# Patient Record
Sex: Female | Born: 1950
Health system: Southern US, Community
[De-identification: ages and names within clinical notes are randomized; demographics above are authoritative.]

## PROBLEM LIST (undated history)

## (undated) DIAGNOSIS — L409 Psoriasis, unspecified: Secondary | ICD-10-CM

## (undated) DIAGNOSIS — E039 Hypothyroidism, unspecified: Secondary | ICD-10-CM

## (undated) HISTORY — PX: EYE SURGERY: SHX253

---

## 1997-10-29 ENCOUNTER — Other Ambulatory Visit: Admission: RE | Admit: 1997-10-29 | Discharge: 1997-10-29 | Payer: Self-pay | Admitting: *Deleted

## 1997-11-21 ENCOUNTER — Other Ambulatory Visit: Admission: RE | Admit: 1997-11-21 | Discharge: 1997-11-21 | Payer: Self-pay | Admitting: Obstetrics and Gynecology

## 1998-11-29 ENCOUNTER — Other Ambulatory Visit: Admission: RE | Admit: 1998-11-29 | Discharge: 1998-11-29 | Payer: Self-pay | Admitting: Obstetrics and Gynecology

## 1998-12-13 ENCOUNTER — Encounter (HOSPITAL_BASED_OUTPATIENT_CLINIC_OR_DEPARTMENT_OTHER): Payer: Self-pay | Admitting: Internal Medicine

## 1998-12-13 ENCOUNTER — Ambulatory Visit (HOSPITAL_COMMUNITY): Admission: RE | Admit: 1998-12-13 | Discharge: 1998-12-13 | Payer: Self-pay | Admitting: Internal Medicine

## 2000-03-22 ENCOUNTER — Other Ambulatory Visit: Admission: RE | Admit: 2000-03-22 | Discharge: 2000-03-22 | Payer: Self-pay | Admitting: *Deleted

## 2001-02-27 ENCOUNTER — Emergency Department: Admission: EM | Admit: 2001-02-27 | Discharge: 2001-02-27 | Payer: Self-pay | Admitting: Emergency Medicine

## 2001-02-27 ENCOUNTER — Encounter: Payer: Self-pay | Admitting: Emergency Medicine

## 2002-03-08 ENCOUNTER — Other Ambulatory Visit: Admission: RE | Admit: 2002-03-08 | Discharge: 2002-03-08 | Payer: Self-pay | Admitting: Obstetrics & Gynecology

## 2003-10-18 ENCOUNTER — Other Ambulatory Visit: Admission: RE | Admit: 2003-10-18 | Discharge: 2003-10-18 | Payer: Self-pay | Admitting: Obstetrics and Gynecology

## 2005-06-19 ENCOUNTER — Encounter: Admission: RE | Admit: 2005-06-19 | Discharge: 2005-06-19 | Payer: Self-pay | Admitting: *Deleted

## 2008-11-14 ENCOUNTER — Encounter: Admission: RE | Admit: 2008-11-14 | Discharge: 2008-11-14 | Payer: Self-pay | Admitting: Family Medicine

## 2010-10-07 ENCOUNTER — Other Ambulatory Visit: Payer: Self-pay | Admitting: Obstetrics and Gynecology

## 2010-10-07 DIAGNOSIS — R1011 Right upper quadrant pain: Secondary | ICD-10-CM

## 2010-10-08 ENCOUNTER — Ambulatory Visit
Admission: RE | Admit: 2010-10-08 | Discharge: 2010-10-08 | Disposition: A | Discharge status: Home / Self Care | Payer: BC Managed Care – PPO | Source: Ambulatory Visit | Attending: Obstetrics and Gynecology | Admitting: Obstetrics and Gynecology

## 2010-10-08 DIAGNOSIS — R1011 Right upper quadrant pain: Secondary | ICD-10-CM

## 2010-10-13 ENCOUNTER — Other Ambulatory Visit (HOSPITAL_COMMUNITY): Payer: Self-pay | Admitting: Family Medicine

## 2010-10-13 DIAGNOSIS — R1011 Right upper quadrant pain: Secondary | ICD-10-CM

## 2010-10-23 ENCOUNTER — Encounter (HOSPITAL_COMMUNITY)
Admission: RE | Admit: 2010-10-23 | Discharge: 2010-10-23 | Disposition: A | Discharge status: Home / Self Care | Payer: BC Managed Care – PPO | Source: Ambulatory Visit | Attending: Family Medicine | Admitting: Family Medicine

## 2010-10-23 DIAGNOSIS — R1011 Right upper quadrant pain: Secondary | ICD-10-CM | POA: Insufficient documentation

## 2010-10-23 MED ORDER — TECHNETIUM TC 99M MEBROFENIN IV KIT
5.0000 | PACK | Freq: Once | INTRAVENOUS | Status: AC | PRN
  Administered 2010-10-23: 5 via INTRAVENOUS

## 2010-10-30 ENCOUNTER — Other Ambulatory Visit: Payer: Self-pay | Admitting: Gastroenterology

## 2010-11-03 ENCOUNTER — Other Ambulatory Visit: Payer: BC Managed Care – PPO

## 2011-12-04 IMAGING — NM NM HEPATO W/GB/PHARM/[PERSON_NAME]
2 series · 12 of 12 positions shown · non-contrast
Comparison: Ultrasound 10/08/2010

CLINICAL DATA: History of right upper quadrant pain.

NUCLEAR MEDICINE HEPATOBILIARY IMAGING WITH GALLBLADDER EF
TECHNIQUE: Sequential images of the abdomen were obtained [DATE]
minutes following intravenous administration of
radiopharmaceutical. After slow intravenous infusion of 1.68 uCg
Cholecystokinin, gallbladder ejection fraction was determined.
Radiopharmaceutical: 5.0 mCi Nc-BBm Choletec

[hepato · 3.20mm/px · 6 of 46 frames shown (1 of 2)]
[frame 4/46]
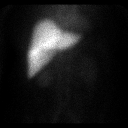
[frame 12/46]
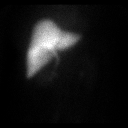
[frame 20/46]
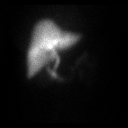
[frame 27/46]
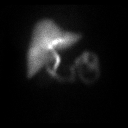
[frame 35/46]
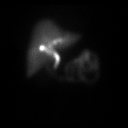
[frame 43/46]
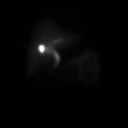

[hepato · 3.20mm/px · 6 of 31 frames shown (2 of 2)]
[frame 3/31]
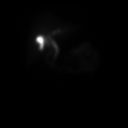
[frame 8/31]
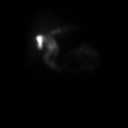
[frame 13/31]
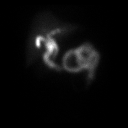
[frame 18/31]
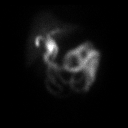
[frame 23/31]
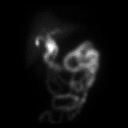
[frame 29/31]
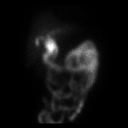

[12 of 12 positions shown; findings below may reference images not displayed]

FINDINGS: There is prompt visualization of hepatic activity. There
is prompt visualization of the common bile duct. Subsequently
intestinal activity was identified.

The gallbladder began to visualize at 31 minutes.

During CCK infusion the gallbladder contracted 93%. 30% or greater
is normal range.

During CCK infusion the patient reported  experiencing abdominal
discomfort during the last 10 minutes of CCK  infusion.
IMPRESSION: There is demonstration of patency of the common bile duct and the
cystic duct. There is no evidence of cholecystitis.

During CCK infusion the gallbladder contracted 93%. 30% or greater
is normal range.

During CCK infusion the patient reported  experiencing abdominal
discomfort during the last 10 minutes of CCK  infusion.

## 2019-12-27 ENCOUNTER — Other Ambulatory Visit: Payer: Self-pay

## 2019-12-27 ENCOUNTER — Encounter (HOSPITAL_BASED_OUTPATIENT_CLINIC_OR_DEPARTMENT_OTHER): Payer: Self-pay | Admitting: *Deleted

## 2019-12-27 ENCOUNTER — Emergency Department (HOSPITAL_BASED_OUTPATIENT_CLINIC_OR_DEPARTMENT_OTHER)
Admission: EM | Admit: 2019-12-27 | Discharge: 2019-12-27 | Disposition: A | Discharge status: Home / Self Care | Payer: Medicare Other | Attending: Emergency Medicine | Admitting: Emergency Medicine

## 2019-12-27 DIAGNOSIS — E039 Hypothyroidism, unspecified: Secondary | ICD-10-CM | POA: Insufficient documentation

## 2019-12-27 DIAGNOSIS — Y999 Unspecified external cause status: Secondary | ICD-10-CM | POA: Diagnosis not present

## 2019-12-27 DIAGNOSIS — L03115 Cellulitis of right lower limb: Secondary | ICD-10-CM | POA: Insufficient documentation

## 2019-12-27 DIAGNOSIS — Y929 Unspecified place or not applicable: Secondary | ICD-10-CM | POA: Diagnosis not present

## 2019-12-27 DIAGNOSIS — Z79899 Other long term (current) drug therapy: Secondary | ICD-10-CM | POA: Insufficient documentation

## 2019-12-27 DIAGNOSIS — S80861A Insect bite (nonvenomous), right lower leg, initial encounter: Secondary | ICD-10-CM | POA: Diagnosis present

## 2019-12-27 DIAGNOSIS — W57XXXA Bitten or stung by nonvenomous insect and other nonvenomous arthropods, initial encounter: Secondary | ICD-10-CM | POA: Insufficient documentation

## 2019-12-27 DIAGNOSIS — Y939 Activity, unspecified: Secondary | ICD-10-CM | POA: Diagnosis not present

## 2019-12-27 HISTORY — DX: Hypothyroidism, unspecified: E03.9

## 2019-12-27 MED ORDER — TRIAMCINOLONE ACETONIDE 0.1 % EX CREA: 1.0000 "application " | TOPICAL_CREAM | Freq: Two times a day (BID) | CUTANEOUS | 0 refills | Status: DC

## 2019-12-27 MED ORDER — DOXYCYCLINE HYCLATE 100 MG PO CAPS: 100.0000 mg | ORAL_CAPSULE | Freq: Two times a day (BID) | ORAL | 0 refills | Status: DC

## 2019-12-27 MED FILL — DOXYCYCLINE HYCLATE 100 MG: 100 | 7 days supply | Qty: 14 | Fill #0

## 2019-12-27 MED FILL — TRIAMCINOLONE 0.1% CREAM: 0.1 | 30 days supply | Qty: 30 | Fill #0

## 2019-12-27 NOTE — ED Notes (Signed)
AVS reviewed with client, area at rt leg unchanged since arrival, reviewed the two (2) RX written by the ED provider. Opportunity for questions provided.

## 2019-12-27 NOTE — Discharge Instructions (Signed)

## 2019-12-27 NOTE — ED Provider Notes (Signed)
[facility] EMERGENCY DEPARTMENT Provider Note   CSN: [id number] Arrival date & time: 12/27/19  1030     History Chief Complaint  Patient presents with  . Insect bite    Christina Yates is a 69 y.o. female.  Who presents emergency department chief complaint of insect bite and leg pain.  The patient states that she felt something sting or bite her on the right lower leg 2 days ago.  She applied ice to the affected area.  Over the past 48 hours the leg has become more and more painful, she is having pain with walking, palpation of the area.  It is better with ice on it.  The redness has spread around the front of her leg.  She denies fevers, chills, body aches.  HPI     Past Medical History:  Diagnosis Date  . Hypothyroid     There are no problems to display for this patient.   The histories are not reviewed yet. Please review them in the "History" navigator section and refresh this SmartLink.   OB History   No obstetric history on file.     History reviewed. No pertinent family history.  Social History   Tobacco Use  . Smoking status: Never Smoker  Substance Use Topics  . Alcohol use: Never  . Drug use: Never    Home Medications Prior to Admission medications   Medication Sig Start Date End Date Taking? Authorizing Provider  levothyroxine (SYNTHROID) 100 MCG tablet Take 100 mcg by mouth daily before breakfast.   Yes [provider]    Allergies    Zithromax [azithromycin]  Review of Systems   Review of Systems Ten systems reviewed and are negative for acute change, except as noted in the HPI.   Physical Exam Updated Vital Signs BP 127/70 (BP Location: Right Arm)   Pulse 80   Temp 98.1 F (36.7 C) (Oral)   Resp 18   Ht 5\' 8"  (1.727 m)   Wt 72.6 kg   SpO2 100%   BMI 24.33 kg/m   Physical Exam Vitals and nursing note reviewed.  Constitutional:      General: She is not in acute distress.    Appearance: She is well-developed. She  is not diaphoretic.  HENT:     Head: Normocephalic and atraumatic.  Eyes:     General: No scleral icterus.    Conjunctiva/sclera: Conjunctivae normal.  Cardiovascular:     Rate and Rhythm: Normal rate and regular rhythm.     Heart sounds: Normal heart sounds. No murmur heard.  No friction rub. No gallop.   Pulmonary:     Effort: Pulmonary effort is normal. No respiratory distress.     Breath sounds: Normal breath sounds.  Abdominal:     General: Bowel sounds are normal. There is no distension.     Palpations: Abdomen is soft. There is no mass.     Tenderness: There is no abdominal tenderness. There is no guarding.  Musculoskeletal:     Cervical back: Normal range of motion.     Comments: Erythema and induration of about 6 x 6 cm in the posterior right lower extremity.  It is tender to the touch without fluctuance.  There is some extending erythema without lymphangitis primarily around the area of induration.  There is some mild erythema spreading around the front of the leg.  DP and PT pulse 2+ bilaterally.  Normal sensation.  Skin:    General: Skin is warm and  dry.  Neurological:     Mental Status: She is alert and oriented to person, place, and time.  Psychiatric:        Behavior: Behavior normal.     ED Results / Procedures / Treatments   Labs (all labs ordered are listed, but only abnormal results are displayed) Labs Reviewed - No data to display  EKG None  Radiology No results found.  Procedures Procedures (including critical care time)  Medications Ordered in ED Medications - No data to display  ED Course  I have reviewed the triage vital signs and the nursing notes.  Pertinent labs & imaging results that were available during my care of the patient were reviewed by me and considered in my medical decision making (see chart for details).    MDM Rules/Calculators/A&P                          Patient with recent insect bite to the right lower extremity.   This is likely complicated, hyper reactivity to the sting versus cellulitis.  Patient will be covered with doxycycline.  Topical triamcinolone cream for decreased inflammatory response locally.  Discussed return precautions and outpatient follow-up.  Patient understands these return precautions clearly.  She appears appropriate for discharge at this time Final Clinical Impression(s) / ED Diagnoses Final diagnoses:  None    Rx / DC Orders ED Discharge Orders    None       Arthor Captain, PA-C 12/27/19 1205    Pollyann Savoy, MD 12/27/19 1455

## 2019-12-27 NOTE — ED Triage Notes (Signed)
States she was stung by an insect, believes it was a wasp, occurred this past Monday. Has a large red area at rt calf area, itching and painful, walking makes pain worse

## 2020-02-08 ENCOUNTER — Telehealth: Payer: Self-pay | Admitting: Hematology and Oncology

## 2020-02-08 NOTE — Telephone Encounter (Signed)
Received a new hem referral from [facility] for low platelets. Ms. Bornhorst returned my call to schedule a new hem appt w/Dr. Pamelia Hoit on 8/26 at 345pm. Pt aware to arrive 15 minutes early.

## 2020-02-14 NOTE — Progress Notes (Signed)
[facility] CONSULT NOTE  Patient Care Team: Jonnie Kind, MD as PCP - General (Family Medicine)  CHIEF COMPLAINTS/PURPOSE OF CONSULTATION:  Newly diagnosed thrombocytopenia  HISTORY OF PRESENTING ILLNESS:  Christina Yates 69 y.o. female is here because of recent diagnosis of persistent thrombocytopenia. She is referred from [facility]. Labs on 12/19/19 showed Hg 11.7, HCT 35.8, platelets 70, iron saturation 31%, ferritin 60, B-12 340, folate >20.0. Platelets were 137 on 11/06/18, 91 on 11/07/19, and 70 on 12/19/19. She presents to the clinic today for initial evaluation.  She has not noticed any bruising or bleeding symptoms.  This is never happened before.  I reviewed her records extensively and collaborated the history with the patient.  MEDICAL HISTORY:  Past Medical History:  Diagnosis Date  . Hypothyroid     SURGICAL HISTORY:  The histories are not reviewed yet. Please review them in the "History" navigator section and refresh this SmartLink.  SOCIAL HISTORY: Social History   Socioeconomic History  . Marital status: Married    Spouse name: Not on file  . Number of children: Not on file  . Years of education: Not on file  . Highest education level: Not on file  Occupational History  . Not on file  Tobacco Use  . Smoking status: Never Smoker  Substance and Sexual Activity  . Alcohol use: Never  . Drug use: Never  . Sexual activity: Not Currently  Other Topics Concern  . Not on file  Social History Narrative  . Not on file   Social Determinants of Health   Financial Resource Strain:   . Difficulty of Paying Living Expenses: Not on file  Food Insecurity:   . Worried About Programme researcher, broadcasting/film/video in the Last Year: Not on file  . Ran Out of Food in the Last Year: Not on file  Transportation Needs:   . Lack of Transportation (Medical): Not on file  . Lack of Transportation (Non-Medical): Not on file  Physical Activity:   . Days  of Exercise per Week: Not on file  . Minutes of Exercise per Session: Not on file  Stress:   . Feeling of Stress : Not on file  Social Connections:   . Frequency of Communication with Friends and Family: Not on file  . Frequency of Social Gatherings with Friends and Family: Not on file  . Attends Religious Services: Not on file  . Active Member of Clubs or Organizations: Not on file  . Attends Banker Meetings: Not on file  . Marital Status: Not on file  Intimate Partner Violence:   . Fear of Current or Ex-Partner: Not on file  . Emotionally Abused: Not on file  . Physically Abused: Not on file  . Sexually Abused: Not on file    FAMILY HISTORY: No family history on file.  ALLERGIES:  is allergic to zithromax [azithromycin].  MEDICATIONS:  Current Outpatient Medications  Medication Sig Dispense Refill  . levothyroxine (SYNTHROID) 100 MCG tablet Take 100 mcg by mouth daily before breakfast.     No current facility-administered medications for this visit.    REVIEW OF SYSTEMS:   Constitutional: Denies fevers, chills or abnormal night sweats Eyes: Denies blurriness of vision, double vision or watery eyes Ears, nose, mouth, throat, and face: Denies mucositis or sore throat Respiratory: Denies cough, dyspnea or wheezes Cardiovascular: Denies palpitation, chest discomfort or lower extremity swelling Gastrointestinal:  Denies nausea, heartburn or change in bowel habits Skin: Denies abnormal  skin rashes Lymphatics: Denies new lymphadenopathy or easy bruising Neurological:Denies numbness, tingling or new weaknesses Behavioral/Psych: Mood is stable, no new changes  Breast: Denies any palpable lumps or discharge All other systems were reviewed with the patient and are negative.  PHYSICAL EXAMINATION: ECOG PERFORMANCE STATUS: 1 - Symptomatic but completely ambulatory  Vitals:   02/15/20 1541  BP: 132/60  Pulse: 73  Resp: 17  Temp: 98.3 F (36.8 C)  SpO2: 100%    Filed Weights   02/15/20 1541  Weight: 160 lb 14.4 oz (73 kg)    GENERAL:alert, no distress and comfortable SKIN: skin color, texture, turgor are normal, no rashes or significant lesions EYES: normal, conjunctiva are pink and non-injected, sclera clear OROPHARYNX:no exudate, no erythema and lips, buccal mucosa, and tongue normal  NECK: supple, thyroid normal size, non-tender, without nodularity LYMPH:  no palpable lymphadenopathy in the cervical, axillary or inguinal LUNGS: clear to auscultation and percussion with normal breathing effort HEART: regular rate & rhythm and no murmurs and no lower extremity edema ABDOMEN:abdomen soft, non-tender and normal bowel sounds Musculoskeletal:no cyanosis of digits and no clubbing  PSYCH: alert & oriented x 3 with fluent speech NEURO: no focal motor/sensory deficits  LABORATORY DATA:  I have reviewed the data as listed Lab Results  Component Value Date   WBC 5.7 02/15/2020   HGB 11.9 (L) 02/15/2020   HCT 36.5 02/15/2020   MCV 87.5 02/15/2020   PLT 150 02/15/2020   No results found for: NA, K, CL, CO2  RADIOGRAPHIC STUDIES: I have personally reviewed the radiological reports and agreed with the findings in the report.  ASSESSMENT AND PLAN:  Thrombocytopenia (HCC)  persistent thrombocytopenia. She is referred from [facility].  Lab review: 12/19/19 showed Hg 11.7, HCT 35.8, platelets 70, iron saturation 31%, ferritin 60, B-12 340, folate >20.0.  Platelets were 137 on 11/06/19,  11/07/19: 91 12/19/19: 70   Differential diagnosis: 1. Low-grade ITP 2. medication induced: On further review patient is not taking any medications that are associated with thrombocytopenia 3. Bone marrow factors 4. Hepatitis B/C 5. Splenomegaly: No enlarged spleen was palpable to physical exam. 6.  Spurious thrombocytopenia due to clumping: We did not notice any clumping on EDTA.  Repeat labs 02/15/2020: Platelets 150 Given her history  of Hashimoto's thyroiditis and psoriatic arthritis suspect suspect that she may have an autoimmune thrombocytopenia.  However today her platelet counts have improved to 150. At this point there is no further indication for treatment or further evaluation. If her platelet counts drop below 50, then she will need to be referred back to [place]. Patient will be seen on an as-needed basis.  All questions were answered. The patient knows to call the clinic with any problems, questions or concerns.   Sabas Sous, MD, MPH 02/15/2020    I, Molly Dorshimer, am acting as scribe for Serena Croissant, MD.  I have reviewed the above documentation for accuracy and completeness, and I agree with the above.

## 2020-02-15 ENCOUNTER — Other Ambulatory Visit: Payer: Self-pay

## 2020-02-15 ENCOUNTER — Inpatient Hospital Stay (HOSPITAL_BASED_OUTPATIENT_CLINIC_OR_DEPARTMENT_OTHER): Payer: Medicare Other | Admitting: Hematology and Oncology

## 2020-02-15 ENCOUNTER — Inpatient Hospital Stay: Payer: Medicare Other | Attending: Hematology and Oncology

## 2020-02-15 DIAGNOSIS — E039 Hypothyroidism, unspecified: Secondary | ICD-10-CM

## 2020-02-15 DIAGNOSIS — D696 Thrombocytopenia, unspecified: Secondary | ICD-10-CM

## 2020-02-15 LAB — CMP (CANCER CENTER ONLY)
ALT: 13 U/L (ref 0–44)
AST: 21 U/L (ref 15–41)
Albumin: 3.7 g/dL (ref 3.5–5.0)
Alkaline Phosphatase: 74 U/L (ref 38–126)
Anion gap: 8 (ref 5–15)
BUN: 15 mg/dL (ref 8–23)
CO2: 30 mmol/L (ref 22–32)
Calcium: 9.9 mg/dL (ref 8.9–10.3)
Chloride: 103 mmol/L (ref 98–111)
Creatinine: 0.97 mg/dL (ref 0.44–1.00)
GFR, Est AFR Am: >60 mL/min (ref >60)
GFR, Estimated: 59 mL/min — ABNORMAL LOW (ref >60)
Glucose, Bld: 89 mg/dL (ref 70–99)
Potassium: 4.5 mmol/L (ref 3.5–5.1)
Sodium: 141 mmol/L (ref 135–145)
Total Bilirubin: 0.6 mg/dL (ref 0.3–1.2)
Total Protein: 7.4 g/dL (ref 6.5–8.1)

## 2020-02-15 LAB — CBC WITH DIFFERENTIAL (CANCER CENTER ONLY)
Abs Immature Granulocytes: 0.02 10*3/uL (ref 0.00–0.07)
Basophils Absolute: 0 10*3/uL (ref 0.0–0.1)
Basophils Relative: 1 %
Eosinophils Absolute: 0.1 10*3/uL (ref 0.0–0.5)
Eosinophils Relative: 2 %
HCT: 36.5 % (ref 36.0–46.0)
Hemoglobin: 11.9 g/dL — ABNORMAL LOW (ref 12.0–15.0)
Immature Granulocytes: 0 %
Lymphocytes Relative: 25 %
Lymphs Abs: 1.4 10*3/uL (ref 0.7–4.0)
MCH: 28.5 pg (ref 26.0–34.0)
MCHC: 32.6 g/dL (ref 30.0–36.0)
MCV: 87.5 fL (ref 80.0–100.0)
Monocytes Absolute: 0.6 10*3/uL (ref 0.1–1.0)
Monocytes Relative: 10 %
Neutro Abs: 3.5 10*3/uL (ref 1.7–7.7)
Neutrophils Relative %: 62 %
Platelet Count: 150 10*3/uL (ref 150–400)
RBC: 4.17 MIL/uL (ref 3.87–5.11)
RDW: 12.7 % (ref 11.5–15.5)
WBC Count: 5.7 10*3/uL (ref 4.0–10.5)
nRBC: 0 % (ref 0.0–0.2)

## 2020-02-15 LAB — HEPATITIS B SURFACE ANTIGEN: Hepatitis B Surface Ag: NONREACTIVE

## 2020-02-15 LAB — HEPATITIS C ANTIBODY: HCV Ab: NONREACTIVE

## 2020-02-15 LAB — IMMATURE PLATELET FRACTION: Immature Platelet Fraction: 5.4 % (ref 1.2–8.6)

## 2020-02-15 LAB — PLATELET BY CITRATE

## 2020-02-15 LAB — VITAMIN B12: Vitamin B-12: 177 pg/mL — ABNORMAL LOW (ref 180–914)

## 2020-02-15 NOTE — Assessment & Plan Note (Signed)
persistent thrombocytopenia. She is referred from [facility].  Lab review: 12/19/19 showed Hg 11.7, HCT 35.8, platelets 70, iron saturation 31%, ferritin 60, B-12 340, folate >20.0.  Platelets were 137 on 11/06/19,  11/07/19: 91 12/19/19: 70  Differential diagnosis: 1. Low-grade ITP 2. medication induced: On further review patient is not taking any medications that are associated with thrombocytopenia 3. Bone marrow factors 4. Hepatitis B/C 5. Splenomegaly: No enlarged spleen was palpable to physical exam. 6.  Spurious thrombocytopenia due to clumping

## 2020-02-16 ENCOUNTER — Telehealth: Payer: Self-pay

## 2020-02-16 ENCOUNTER — Telehealth: Payer: Self-pay | Admitting: Hematology and Oncology

## 2020-02-16 NOTE — Telephone Encounter (Signed)
No 8/26 los, no changes made to pt schedule  

## 2020-02-16 NOTE — Telephone Encounter (Signed)
Pt called to inquire about lab results from 8/26. Noticed Vit B12 is low. Please advise.

## 2020-02-19 ENCOUNTER — Telehealth: Payer: Self-pay | Admitting: Hematology and Oncology

## 2020-02-19 NOTE — Telephone Encounter (Signed)
I left a voicemail for the patient that the B12 level is extremely low and that she needs to take 5000 mcg of sublingual B12 daily or discussed with her primary care physician or [facility] for further directions regarding this.

## 2020-07-28 ENCOUNTER — Encounter (HOSPITAL_BASED_OUTPATIENT_CLINIC_OR_DEPARTMENT_OTHER): Payer: Self-pay | Admitting: Emergency Medicine

## 2020-07-28 ENCOUNTER — Other Ambulatory Visit: Payer: Self-pay

## 2020-07-28 ENCOUNTER — Emergency Department (HOSPITAL_BASED_OUTPATIENT_CLINIC_OR_DEPARTMENT_OTHER)
Admission: EM | Admit: 2020-07-28 | Discharge: 2020-07-28 | Disposition: A | Discharge status: Home / Self Care | Payer: Medicare Other | Attending: Emergency Medicine | Admitting: Emergency Medicine

## 2020-07-28 DIAGNOSIS — R21 Rash and other nonspecific skin eruption: Secondary | ICD-10-CM | POA: Diagnosis present

## 2020-07-28 DIAGNOSIS — Z79899 Other long term (current) drug therapy: Secondary | ICD-10-CM | POA: Insufficient documentation

## 2020-07-28 DIAGNOSIS — B029 Zoster without complications: Secondary | ICD-10-CM | POA: Diagnosis not present

## 2020-07-28 DIAGNOSIS — E039 Hypothyroidism, unspecified: Secondary | ICD-10-CM | POA: Diagnosis not present

## 2020-07-28 HISTORY — DX: Psoriasis, unspecified: L40.9

## 2020-07-28 MED ORDER — VALACYCLOVIR HCL 1 G PO TABS: 1000.0000 mg | ORAL_TABLET | Freq: Three times a day (TID) | ORAL | 0 refills | Status: AC

## 2020-07-28 NOTE — ED Triage Notes (Signed)
Blistery rash to L abd with pain to L arm. Concerned for shingles.

## 2020-07-28 NOTE — Discharge Instructions (Signed)
At this time there does not appear to be the presence of an emergent medical condition, however there is always the potential for conditions to change. Please read and follow the below instructions.  Please return to the Emergency Department immediately for any new or worsening symptoms. Please be sure to follow up with your Primary Care Provider within one week regarding your visit today; please call their office to schedule an appointment even if you are feeling better for a follow-up visit. Please take the medication Valtrex as prescribed to help with your shingles rash.  Please see your primary care doctor this week for recheck.  Go to the nearest Emergency Department immediately if: You have fever or chills The rash is on your face or nose. You have pain in your face or pain by your eye. You lose feeling on one side of your face. You have trouble seeing. You have ear pain, or you have ringing in your ear. You have a loss of taste. You have any new/concerning or worsening of symtpoms   Please read the additional information packets attached to your discharge summary.  Do not take your medicine if  develop an itchy rash, swelling in your mouth or lips, or difficulty breathing; call 911 and seek immediate emergency medical attention if this occurs.  You may review your lab tests and imaging results in their entirety on your MyChart account.  Please discuss all results of fully with your primary care provider and other specialist at your follow-up visit.  Note: Portions of this text may have been transcribed using voice recognition software. Every effort was made to ensure accuracy; however, inadvertent computerized transcription errors may still be present.

## 2020-07-28 NOTE — ED Provider Notes (Signed)
[facility] EMERGENCY DEPARTMENT Provider Note   CSN: [id number] Arrival date & time: 07/28/20  1121     History Chief Complaint  Patient presents with  . Rash    Christina Yates is a 70 y.o. female history of hypothyroidism and psoriasis.  Patient arrives today with concern of rash to her left side onset 3 days ago.  Patient reports initially she had a tingling sensation to her left side as well as her left arm and a mild itching sensation.  The left arm itching and tingling subsided, the next day she noticed a burning tingling a rash to her left side.  She reports small blisters to the area which have begun to rupture and spread in a linear fashion across her left side.  She reports the burning tingling pain is moderate intensity constant worse with palpation no alleviating factors.  She denies similar in the past.  Reports that she did have chickenpox when she was younger.  Denies fever/chills, headache, vision changes, hearing loss, tinnitus, neck stiffness, chest pain, shortness of breath, nausea/vomiting or any additional concerns   HPI     Past Medical History:  Diagnosis Date  . Hypothyroid   . Psoriasis     Patient Active Problem List   Diagnosis Date Noted  . Thrombocytopenia (HCC) 02/15/2020    Past Surgical History:  Procedure Laterality Date  . EYE SURGERY       OB History   No obstetric history on file.     No family history on file.  Social History   Tobacco Use  . Smoking status: Never Smoker  . Smokeless tobacco: Never Used  Substance Use Topics  . Alcohol use: Never  . Drug use: Never    Home Medications Prior to Admission medications   Medication Sig Start Date End Date Taking? Authorizing Provider  valACYclovir (VALTREX) 1000 MG tablet Take 1 tablet (1,000 mg total) by mouth 3 (three) times daily for 7 days. 07/28/20 08/04/20 Yes Harlene Salts A, PA-C  levothyroxine (SYNTHROID) 100 MCG tablet Take 100 mcg by mouth daily before  breakfast.    [provider]    Allergies    Prednisone and Zithromax [azithromycin]  Review of Systems   Review of Systems  Constitutional: Negative.  Negative for chills and fever.  Eyes: Negative.  Negative for pain and visual disturbance.  Cardiovascular: Negative.  Negative for chest pain.  Gastrointestinal: Negative.  Negative for abdominal pain, nausea and vomiting.  Skin: Positive for rash.  Neurological: Negative.  Negative for headaches.     Physical Exam Updated Vital Signs BP (!) 147/71 (BP Location: Right Arm)   Pulse 77   Temp 98.2 F (36.8 C) (Oral)   Resp 16   Ht 5\' 8"  (1.727 m)   Wt 72.6 kg   SpO2 96%   BMI 24.33 kg/m   Physical Exam Constitutional:      General: She is not in acute distress.    Appearance: Normal appearance. She is well-developed. She is not ill-appearing or diaphoretic.  HENT:     Head: Normocephalic and atraumatic.  Eyes:     General: Vision grossly intact. Gaze aligned appropriately.     Pupils: Pupils are equal, round, and reactive to light.  Neck:     Trachea: Trachea and phonation normal.  Pulmonary:     Effort: Pulmonary effort is normal. No respiratory distress.  Abdominal:     General: There is no distension.     Palpations: Abdomen  is soft.     Tenderness: There is no abdominal tenderness. There is no guarding or rebound.  Musculoskeletal:        General: Normal range of motion.     Cervical back: Normal range of motion.  Skin:    General: Skin is warm and dry.     Findings: Rash present. Rash is vesicular.          Comments: Erythematous grouped vesicular rash of the left side paraspinally T10 distribution. Some ruptured vesicles present  Neurological:     Mental Status: She is alert.     GCS: GCS eye subscore is 4. GCS verbal subscore is 5. GCS motor subscore is 6.     Comments: Speech is clear and goal oriented, follows commands Major Cranial nerves without deficit, no facial droop Moves extremities  without ataxia, coordination intact  Psychiatric:        Behavior: Behavior normal.     ED Results / Procedures / Treatments   Labs (all labs ordered are listed, but only abnormal results are displayed) Labs Reviewed - No data to display  EKG None  Radiology No results found.  Procedures Procedures   Medications Ordered in ED Medications - No data to display  ED Course  I have reviewed the triage vital signs and the nursing notes.  Pertinent labs & imaging results that were available during my care of the patient were reviewed by me and considered in my medical decision making (see chart for details).    MDM Rules/Calculators/A&P                         Additional history obtained from: 1. Nursing notes from this visit. 2. EMR review. ------------------  70 year old female presents today with rash consistent with shingles present in the left T10 distribution onset 3 days ago.  Patient denies any difficulty breathing or swallowing.  Pt has a patent airway without stridor and is handling secretions without difficulty; no angioedema. No pustules, no warmth, no draining sinus tracts, no superficial abscesses, no bullous impetigo, no desquamation, no target lesions with dusky purpura or a central bulla. Not tender to touch. No concern for superimposed infection. No concern for SJS, TEN, TSS, tick borne illness, syphilis or other life-threatening condition.  Will start patient on Valtrex 1 g 3 times daily x7 days and encourage close PCP follow-up.  There is no evidence for zoster ophthalmicus, CNS involvement or other emergent pathologies.  At this time there does not appear to be any evidence of an acute emergency medical condition and the patient appears stable for discharge with appropriate outpatient follow up. Diagnosis was discussed with patient who verbalizes understanding of care plan and is agreeable to discharge. I have discussed return precautions with patient who verbalizes  understanding. Patient encouraged to follow-up with their PCP. All questions answered.  Patient's case discussed with Dr. Renaye Rakers who agrees with plan to discharge with follow-up.   Note: Portions of this report may have been transcribed using voice recognition software. Every effort was made to ensure accuracy; however, inadvertent computerized transcription errors may still be present. Final Clinical Impression(s) / ED Diagnoses Final diagnoses:  Herpes zoster without complication    Rx / DC Orders ED Discharge Orders         Ordered    valACYclovir (VALTREX) 1000 MG tablet  3 times daily        07/28/20 1226  Elizabeth Palau 07/28/20 1229    Terald Sleeper, MD 07/28/20 [phone]

## 2023-11-10 ENCOUNTER — Emergency Department (HOSPITAL_BASED_OUTPATIENT_CLINIC_OR_DEPARTMENT_OTHER): Admission: EM | Admit: 2023-11-10 | Discharge: 2023-11-10 | Disposition: A | Discharge status: Home / Self Care

## 2023-11-10 ENCOUNTER — Encounter (HOSPITAL_BASED_OUTPATIENT_CLINIC_OR_DEPARTMENT_OTHER): Payer: Self-pay | Admitting: Emergency Medicine

## 2023-11-10 ENCOUNTER — Other Ambulatory Visit: Payer: Self-pay

## 2023-11-10 DIAGNOSIS — N390 Urinary tract infection, site not specified: Secondary | ICD-10-CM | POA: Insufficient documentation

## 2023-11-10 DIAGNOSIS — E039 Hypothyroidism, unspecified: Secondary | ICD-10-CM | POA: Insufficient documentation

## 2023-11-10 DIAGNOSIS — R300 Dysuria: Secondary | ICD-10-CM | POA: Diagnosis present

## 2023-11-10 LAB — URINALYSIS, ROUTINE W REFLEX MICROSCOPIC
Bilirubin Urine: NEGATIVE
Glucose, UA: NEGATIVE mg/dL
Ketones, ur: NEGATIVE mg/dL
Nitrite: NEGATIVE
Protein, ur: NEGATIVE mg/dL
Specific Gravity, Urine: <=1.005 (ref 1.005–1.030)
pH: 5.5 (ref 5.0–8.0)

## 2023-11-10 LAB — URINALYSIS, MICROSCOPIC (REFLEX)

## 2023-11-10 MED ORDER — CEPHALEXIN 250 MG PO CAPS
500.0000 mg | ORAL_CAPSULE | Freq: Once | ORAL | Status: AC
  Administered 2023-11-10: 500 mg via ORAL
  Filled 2023-11-10: qty 2

## 2023-11-10 MED ORDER — PHENAZOPYRIDINE HCL 200 MG PO TABS: 200.0000 mg | ORAL_TABLET | Freq: Three times a day (TID) | ORAL | 0 refills | Status: AC

## 2023-11-10 MED ORDER — CEPHALEXIN 500 MG PO CAPS: 500.0000 mg | ORAL_CAPSULE | Freq: Two times a day (BID) | ORAL | 0 refills | Status: AC

## 2023-11-10 NOTE — ED Provider Notes (Signed)
 [facility] Provider Note   CSN: [id number] Arrival date & time: 11/10/23  1303     History  Chief Complaint  Patient presents with   Dysuria    Christina Yates is a 73 y.o. female.  73 year old female with past medical history of hypothyroidism and psoriasis presenting to the emergency department today with increased urinary frequency and urgency over the past 3 to 4 days.  The patient states that she is having some dysuria as well.  States this is similar to previous episodes of urinary tract infections in the past.  She denies any flank pain.  Denies any fevers.  Has not had any nausea or vomiting.  She came to the ER today for further evaluation regarding this.  She states that she has been taking some over-the-counter medications for UTI which have not been helping.   Dysuria      Home Medications Prior to Admission medications   Medication Sig Start Date End Date Taking? Authorizing Provider  cephALEXin (KEFLEX) 500 MG capsule Take 1 capsule (500 mg total) by mouth 2 (two) times daily. 11/10/23  Yes Carin Charleston, MD  phenazopyridine (PYRIDIUM) 200 MG tablet Take 1 tablet (200 mg total) by mouth 3 (three) times daily. 11/10/23  Yes Carin Charleston, MD  levothyroxine (SYNTHROID) 100 MCG tablet Take 100 mcg by mouth daily before breakfast.    [provider]      Allergies    Prednisone and Zithromax [azithromycin]    Review of Systems   Review of Systems  Genitourinary:  Positive for dysuria.  All other systems reviewed and are negative.   Physical Exam Updated Vital Signs BP (!) 148/69 (BP Location: Right Arm)   Pulse 77   Temp 98 F (36.7 C) (Oral)   Resp 20   Ht 5' 8.5" (1.74 m)   Wt 72.6 kg   SpO2 100%   BMI 23.97 kg/m  Physical Exam Vitals and nursing note reviewed.   Gen: NAD Eyes: PERRL, EOMI HEENT: no oropharyngeal swelling Neck: trachea midline Resp: clear to auscultation bilaterally Card:  RRR, no murmurs, rubs, or gallops Abd: nontender, nondistended, no CVA tenderness noted Psyc: acting appropriately   ED Results / Procedures / Treatments   Labs (all labs ordered are listed, but only abnormal results are displayed) Labs Reviewed  URINALYSIS, ROUTINE W REFLEX MICROSCOPIC - Abnormal; Notable for the following components:      Result Value   APPearance HAZY (*)    Hgb urine dipstick MODERATE (*)    Leukocytes,Ua MODERATE (*)    All other components within normal limits  URINALYSIS, MICROSCOPIC (REFLEX) - Abnormal; Notable for the following components:   Bacteria, UA FEW (*)    All other components within normal limits    EKG None  Radiology No results found.  Procedures Procedures    Medications Ordered in ED Medications  cephALEXin (KEFLEX) capsule 500 mg (has no administration in time range)    ED Course/ Medical Decision Making/ A&P                                 Medical Decision Making 73 year old female with past medical history of hypothyroidism and psoriasis presenting to the emergency department today with increased urinary frequency and urgency.  Will further evaluate patient here with urinalysis to evaluate for UTI.  She does not have any symptoms or findings on  exam to suggest pyelonephritis at this time.  She otherwise well-appearing with stable vital signs.  Will reevaluate for ultimate disposition.  The patient's urinalysis does show findings consistent with infection.  She is covered with Keflex.  Is discharged with return precautions.  Amount and/or Complexity of Data Reviewed Labs: ordered.  Risk Prescription drug management.           Final Clinical Impression(s) / ED Diagnoses Final diagnoses:  Lower urinary tract infectious disease    Rx / DC Orders ED Discharge Orders          Ordered    cephALEXin (KEFLEX) 500 MG capsule  2 times daily        11/10/23 1343    phenazopyridine (PYRIDIUM) 200 MG tablet  3 times  daily        11/10/23 1343              Carin Charleston, MD 11/10/23 1344

## 2023-11-10 NOTE — ED Triage Notes (Signed)
 Pt reports dysuria that started while traveling over the weekend

## 2023-11-10 NOTE — Discharge Instructions (Addendum)
 Please take the Packs as prescribed.  Follow-up with your doctor.  Return to the ER for worsening symptoms.  Take the Pyridium as needed.

## 2024-02-16 ENCOUNTER — Ambulatory Visit (INDEPENDENT_AMBULATORY_CARE_PROVIDER_SITE_OTHER): Payer: Self-pay | Admitting: Podiatry

## 2024-02-16 ENCOUNTER — Encounter: Payer: Self-pay | Admitting: Podiatry

## 2024-02-16 DIAGNOSIS — L600 Ingrowing nail: Secondary | ICD-10-CM

## 2024-02-16 NOTE — Patient Instructions (Signed)

## 2024-02-16 NOTE — Progress Notes (Signed)
  Subjective:  Patient ID: Christina Yates, female    DOB: 31-Jul-1950,   MRN: [id number]  Chief Complaint  Patient presents with   Nail Problem    I have an ingrown toenail here (lateral border hallux left).  It's been very painful and I can't wear closed toe shoes.    73 y.o. female presents for concern as above. It has been present for about a week. She has tried keeping polysporin on it but still very tender and painful and would like to get rid of it . Denies any other pedal complaints. Denies n/v/f/c.   Past Medical History:  Diagnosis Date   Hypothyroid    Psoriasis     Objective:  Physical Exam: Vascular: DP/PT pulses 2/4 bilateral. CFT <3 seconds. Normal hair growth on digits. No edema.  Skin. No lacerations or abrasions bilateral feet. Incurvation of lateral border of left hallux with tenderness and mild erythema. No purulence noted  Musculoskeletal: MMT 5/5 bilateral lower extremities in DF, PF, Inversion and Eversion. Deceased ROM in DF of ankle joint.  Neurological: Sensation intact to light touch.   Assessment:   1. Ingrown left greater toenail      Plan:  Patient was evaluated and treated and all questions answered. Discussed ingrown toenails etiology and treatment options including procedure for removal vs conservative care.  Patient requesting removal of ingrown nail today. Procedure below.  Discussed procedure and post procedure care and patient expressed understanding.  Will follow-up in 2 weeks for nail check or sooner if any problems arise.    Procedure:  Procedure: partial Nail Avulsion of left hallux lateral nail border.  Surgeon: Asberry Failing, DPM  Pre-op Dx: Ingrown toenail without infection Post-op: Same  Place of Surgery: Office exam room.  Indications for surgery: Painful and ingrown toenail.    The patient is requesting removal of nail with  chemical matrixectomy. Risks and complications were discussed with the patient for which they  understand and written consent was obtained. Under sterile conditions a total of 3 mL of  1% lidocaine plain was infiltrated in a hallux block fashion. Once anesthetized, the skin was prepped in sterile fashion. A tourniquet was then applied. Next the latearl aspect of hallux nail border was then sharply excised making sure to remove the entire offending nail border.  Next phenol was then applied under standard conditions to permanently destroy the matrix and copiously irrigated. Silvadene was applied. A dry sterile dressing was applied. After application of the dressing the tourniquet was removed and there is found to be an immediate capillary refill time to the digit. The patient tolerated the procedure well without any complications. Post procedure instructions were discussed the patient for which he verbally understood. Follow-up in two weeks for nail check or sooner if any problems are to arise. Discussed signs/symptoms of infection and directed to call the office immediately should any occur or go directly to the emergency room. In the meantime, encouraged to call the office with any questions, concerns, changes symptoms.   Asberry Failing, DPM

## 2024-03-08 ENCOUNTER — Encounter: Payer: Self-pay | Admitting: Podiatry

## 2024-03-08 ENCOUNTER — Ambulatory Visit (INDEPENDENT_AMBULATORY_CARE_PROVIDER_SITE_OTHER): Admitting: Podiatry

## 2024-03-08 DIAGNOSIS — L600 Ingrowing nail: Secondary | ICD-10-CM

## 2024-03-08 NOTE — Progress Notes (Signed)
  Subjective:  Patient ID: Christina Yates, female    DOB: 1950-09-23,   MRN: [id number]  No chief complaint on file.   73 y.o. female presents for follow-up of left ingrown nail procedure relates doing well. Has been soaking as instructed.  Denies any other pedal complaints. Denies n/v/f/c.   Past Medical History:  Diagnosis Date   Hypothyroid    Psoriasis     Objective:  Physical Exam: Vascular: DP/PT pulses 2/4 bilateral. CFT <3 seconds. Normal hair growth on digits. No edema.  Skin. No lacerations or abrasions bilateral feet. Left hallux nail healing well.  Musculoskeletal: MMT 5/5 bilateral lower extremities in DF, PF, Inversion and Eversion. Deceased ROM in DF of ankle joint.  Neurological: Sensation intact to light touch.   Assessment:   1. Ingrown left greater toenail       Plan:  Patient was evaluated and treated and all questions answered. Toe was evaluated and appears to be healing well.  May discontinue soaks and neosporin.  Patient to follow-up as needed.     Asberry Failing, DPM
